# Patient Record
Sex: Female | Born: 1970 | Race: White | Hispanic: No | Marital: Married | Smoking: Never smoker
Health system: Southern US, Community
[De-identification: ages and names within clinical notes are randomized; demographics above are authoritative.]

## PROBLEM LIST (undated history)

## (undated) DIAGNOSIS — D333 Benign neoplasm of cranial nerves: Secondary | ICD-10-CM

## (undated) HISTORY — PX: NOSE SURGERY: SHX723

---

## 2019-01-16 ENCOUNTER — Encounter: Payer: Self-pay | Admitting: Neurology

## 2019-01-16 ENCOUNTER — Ambulatory Visit (INDEPENDENT_AMBULATORY_CARE_PROVIDER_SITE_OTHER): Payer: Managed Care, Other (non HMO) | Admitting: Neurology

## 2019-01-16 ENCOUNTER — Other Ambulatory Visit: Payer: Self-pay

## 2019-01-16 ENCOUNTER — Telehealth: Payer: Self-pay | Admitting: Neurology

## 2019-01-16 VITALS — BP 87/58 | HR 75 | Ht 68.0 in | Wt 144.0 lb

## 2019-01-16 DIAGNOSIS — M25532 Pain in left wrist: Secondary | ICD-10-CM | POA: Diagnosis not present

## 2019-01-16 DIAGNOSIS — M25531 Pain in right wrist: Secondary | ICD-10-CM | POA: Diagnosis not present

## 2019-01-16 DIAGNOSIS — R202 Paresthesia of skin: Secondary | ICD-10-CM

## 2019-01-16 NOTE — Progress Notes (Signed)
Reason for visit: Tongue paresthesias, taste alteration  Referring physician: Dr. Daryl Eastern Gina Joseph is a 47 y.o. female  History of present illness:  Gina Joseph is a 48 year old right-handed white female with a history of some metallic taste alteration that occurred at the end of 2019.  The patient went off of magnesium supplementation and she was seen for a fullness sensation in the left ear through ENT with Dr. Redmond Baseman.  She was treated with medications for allergies and she was given antibiotics.  Around that time, the metallic taste seem to improve.  Within the last 2 to 3 months however, the same problem has come back on but this is now associated with an altered sensation in the lateral portion of her tongue on the left side.  This was not present previously.  Just as previously, she is now developing a fullness sensation in the left ear.  The patient reports no sinus drainage or sinus discomfort.  The patient is not having any pain in the left ear.  She reports some occasional problems with dizziness when she stoops over and then comes back up again.  She might feel a bit off balance when this occurs.  She has not had any falls.  She reports no numbness or weakness of the arms or legs or face.  She has not had any change in speech or swallowing or any alteration in vision or double vision or loss of vision.  She denies any significant neck pain or pain down the arms.  She has no difficulty controlling the bowels or the bladder.  She comes to this office for further evaluation.  History reviewed. No pertinent past medical history.  Past Surgical History:  Procedure Laterality Date  . CESAREAN SECTION     x2  . NOSE SURGERY      Family History  Problem Relation Age of Onset  . High blood pressure Mother   . Stroke Father     Social history:  reports that she has never smoked. She has never used smokeless tobacco. She reports current alcohol use. She reports that she  does not use drugs.  Medications:  Prior to Admission medications   Medication Sig Start Date End Date Taking? Authorizing Provider  Ginkgo Biloba (GINKOBA PO) Take by mouth.   Yes [provider]  Multiple Vitamin (MULTIVITAMIN) capsule Take 1 capsule by mouth daily.   Yes [provider]  Turmeric (QC TUMERIC COMPLEX PO) Take by mouth.   Yes [provider]     No Known Allergies  ROS:  Out of a complete 14 system review of symptoms, the patient complains only of the following symptoms, and all other reviewed systems are negative.  Taste alteration  Blood pressure (!) 87/58, pulse 75, height 5\' 8"  (1.727 m), weight 144 lb (65.3 kg).  Physical Exam  General: The patient is alert and cooperative at the time of the examination.  Eyes: Pupils are equal, round, and reactive to light. Discs are flat bilaterally.  Ears: The left tympanic membrane may be slightly bulging, no inflammation is seen.  The right tympanic membrane appears to be normal.  Neck: The neck is supple, no carotid bruits are noted.  Respiratory: The respiratory examination is clear.  Cardiovascular: The cardiovascular examination reveals a regular rate and rhythm, no obvious murmurs or rubs are noted.  Skin: Extremities are without significant edema.  Neurologic Exam  Mental status: The patient is alert and oriented x 3 at  the time of the examination. The patient has apparent normal recent and remote memory, with an apparently normal attention span and concentration ability.  Cranial nerves: Facial symmetry is present. There is good sensation of the face to pinprick and soft touch bilaterally. The strength of the facial muscles and the muscles to head turning and shoulder shrug are normal bilaterally. Speech is well enunciated, no aphasia or dysarthria is noted. Extraocular movements are full. Visual fields are full. The tongue is midline, and the patient has symmetric elevation of the  soft palate. No obvious hearing deficits are noted.  Motor: The motor testing reveals 5 over 5 strength of all 4 extremities. Good symmetric motor tone is noted throughout.  Sensory: Sensory testing is intact to pinprick, soft touch, vibration sensation, and position sense on all 4 extremities. No evidence of extinction is noted.  Coordination: Cerebellar testing reveals good finger-nose-finger and heel-to-shin bilaterally.  Gait and station: Gait is normal. Tandem gait is normal. Romberg is negative. No drift is seen.  Reflexes: Deep tendon reflexes are symmetric and normal bilaterally. Toes are downgoing bilaterally.   Assessment/Plan:  1.  Subjective taste alteration, left tongue sensory alteration  The patient has had no true change in his sense of detecting flavors of food, she just has a metallic type taste.  She reports no viral illnesses around the time of onset but she does have a fullness sensation in the left ear.  There is a slight bulging of the left eardrum on clinical examination today.  The patient will be set up for MRI of the brain to exclude demyelinating disease, further blood work will be done today.  The patient does report some foot pain and some arthralgia discomfort in the wrists at times.  She will follow-up here depending upon the results of the above evaluation.  Jill Alexanders MD 01/16/2019 9:37 AM  [facility] [address] [place], [place] [address]  Phone [phone] Fax [phone]

## 2019-01-16 NOTE — Telephone Encounter (Signed)
[employer] order sent to GI. They will obtain the auth and reach out to the patient to schedule.  

## 2019-01-18 LAB — ANA W/REFLEX: Anti Nuclear Antibody (ANA): NEGATIVE

## 2019-01-18 LAB — RHEUMATOID FACTOR: Rheumatoid fact SerPl-aCnc: <10 IU/mL (ref 0.0–13.9)

## 2019-01-18 LAB — SEDIMENTATION RATE: Sed Rate: 2 mm/hr (ref 0–32)

## 2019-01-18 LAB — ZINC: Zinc: 94 ug/dL (ref 56–134)

## 2019-01-18 LAB — TSH: TSH: 2.04 u[IU]/mL (ref 0.450–4.500)

## 2019-01-18 LAB — B. BURGDORFI ANTIBODIES: Lyme IgG/IgM Ab: <0.91 {ISR} (ref 0.00–0.90)

## 2019-01-18 LAB — VITAMIN B12: Vitamin B-12: 731 pg/mL (ref 232–1245)

## 2019-01-18 LAB — COPPER, SERUM: Copper: 155 ug/dL (ref 72–166)

## 2019-01-22 ENCOUNTER — Telehealth: Payer: Self-pay

## 2019-01-22 NOTE — Telephone Encounter (Signed)
-----   Message from Kathrynn Ducking, MD sent at 01/18/2019  4:58 PM EDT -----  The blood work results are unremarkable. Please call the patient. ----- Message ----- From: Lavone Neri Lab Results In Sent: 01/17/2019   7:37 AM EDT To: Kathrynn Ducking, MD

## 2019-01-22 NOTE — Telephone Encounter (Signed)
I reached out to pt and advised of results. She verbalized understanding. Pt request my chart activation code to be sent to her mobile #. I have resent.

## 2019-02-12 ENCOUNTER — Other Ambulatory Visit: Payer: Managed Care, Other (non HMO)

## 2019-05-16 ENCOUNTER — Telehealth: Payer: Self-pay | Admitting: Neurology

## 2019-05-16 DIAGNOSIS — M25531 Pain in right wrist: Secondary | ICD-10-CM

## 2019-05-16 DIAGNOSIS — R202 Paresthesia of skin: Secondary | ICD-10-CM

## 2019-05-16 DIAGNOSIS — M25532 Pain in left wrist: Secondary | ICD-10-CM

## 2019-05-16 NOTE — Telephone Encounter (Signed)
I left a voicemail for patient to call back. I need her insurance information. If she calls back please ask her for her insurance information.

## 2019-05-16 NOTE — Telephone Encounter (Signed)
MRI brain with and without contrast re-ordered on Dr. Tobey Grim behalf.

## 2019-05-16 NOTE — Telephone Encounter (Signed)
Noted, thank you

## 2019-05-16 NOTE — Telephone Encounter (Signed)
[employer] Josem [id number] (exp. 05/16/19 to 06/30/19) but because that patient was scheduled at [facility] on 02/12/19 and cancelled I'll need a new MRI order to schedule off the new accession number.

## 2019-05-16 NOTE — Telephone Encounter (Signed)
Patient called back and received insurance information. Patient now has [facility]

## 2019-05-16 NOTE — Telephone Encounter (Signed)
Pt called stating that she had an insurance change and now everything is updated with the new insurance information and she is ready to schedule her MRI. Please advise.

## 2019-05-17 NOTE — Telephone Encounter (Signed)
I spoke to the patient she is going to reach out to her insurance company and get back to me on where she would like to have her MRI at.

## 2019-06-06 NOTE — Telephone Encounter (Signed)
Noted, faxed order to [facility] they will reach out to the patient to schedule.

## 2019-06-06 NOTE — Telephone Encounter (Signed)
Pt has called to inform she has found a location as to where she would like to have her MRI. [facility] ph [phone] fax [phone].  Pt states they need the order and authorization faxed to them.  Pt is asking for a call from Raquel Sarna to discuss cost

## 2019-07-02 ENCOUNTER — Telehealth: Payer: Self-pay | Admitting: Neurology

## 2019-07-02 DIAGNOSIS — D333 Benign neoplasm of cranial nerves: Secondary | ICD-10-CM

## 2019-07-02 NOTE — Telephone Encounter (Signed)
I called the patient.  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  I will send the patient for neurosurgical evaluation, the patient likely will eventually not need open surgery but a gamma knife procedure.  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

## 2019-07-19 ENCOUNTER — Other Ambulatory Visit: Payer: Self-pay | Admitting: Radiation Therapy

## 2019-07-24 ENCOUNTER — Ambulatory Visit
Admission: RE | Admit: 2019-07-24 | Discharge: 2019-07-24 | Disposition: A | Discharge status: Home / Self Care | Payer: 59 | Source: Ambulatory Visit | Attending: Radiation Oncology | Admitting: Radiation Oncology

## 2019-07-24 ENCOUNTER — Telehealth: Payer: Self-pay | Admitting: Radiation Oncology

## 2019-07-24 ENCOUNTER — Encounter: Payer: Self-pay | Admitting: Radiation Oncology

## 2019-07-24 VITALS — Ht 69.0 in | Wt 138.0 lb

## 2019-07-24 DIAGNOSIS — D333 Benign neoplasm of cranial nerves: Secondary | ICD-10-CM

## 2019-07-24 HISTORY — DX: Benign neoplasm of cranial nerves: D33.3

## 2019-07-24 NOTE — Progress Notes (Signed)
Referral from Dr. Annette Stable.   Left sided vestibular schwannoma.   Patient explains that in November 2019 she presented to her PCP with dizziness. She was prescribed an antibiotic which didn't help. When she began having problems with her ear she returned to her PCP who then referred her to ENT. She explains the ENT prescribed Flonase which helped for sometime. Then, she developed numbness on the left side of her tongue and was referred to Dr. Annette Stable.   Current symptoms: Warmth and numbness in left leg and a finger on her left hand. Reports her hearing is diminishing. Reports continued intermittent dizziness. Reports continued occasional unsteadiness. Denies diplopia but explains when she focuses on an object with her eyes it tends to move a bit. Denies headache. Denies facial weakness.   Denies a history of radiation therapy Denies being pregnant  Denies having a pacemaker Denies taking methotrexate.   49 year old female. Married to Anon Raices with two sons. Homemaker.

## 2019-07-24 NOTE — Progress Notes (Signed)
Radiation Oncology         [phone] ________________________________  Initial outpatient Consultation - Conducted via MyChart due to current COVID-19 concerns for limiting patient exposure  Name: Gina Joseph MRN: [id number]  Date of Service: 07/24/2019 DOB: May 31, 1970  [id number], Thayer Headings, NP  Earnie Larsson, MD   REFERRING PHYSICIAN: Earnie Larsson, MD  DIAGNOSIS: The primary encounter diagnosis was Vestibular schwannoma [facility]). A diagnosis of Acoustic neuroma [facility]) was also pertinent to this visit.    ICD-10-CM   1. Vestibular schwannoma ([place])  D33.3   2. Acoustic neuroma [facility])  D33.3     HISTORY OF PRESENT ILLNESS: Gina Joseph is a 49 y.o. female seen at the request of Dr. Annette Stable. She initially presented to her PCP with intermittent dizziness and occasional unsteadiness in 01/2018. She was prescribed an antibiotic but saw no improvement. She then began to develop left ear fullness and was referred to Dr. Redmond Baseman in ENT in 04/2018. She was prescribed Flonase, which helped some but she later developed left-sided tongue numbness and loss of taste in 12/2018, at which time she was referred to Dr. Jannifer Franklin. The recommendation was to proceed with brain MRI but unfortunately, with a change in her insurance, her MRI was delayed. This was eventually performed on 06/28/2019 and revealed a 2.3 cm left vestibular schwannoma, extending into left internal auditory canal/porus acousticus.  She was referred to Dr. Annette Stable to discuss possible surgical resection but both Dr. Annette Stable and the patient felt stereotactic radiosurgery [facility]) would be preferable. Therefore, she has been referred to [place] today to further discuss the potential role of SRS in the management of her acoustic neuroma.  PREVIOUS RADIATION THERAPY: No  PAST MEDICAL HISTORY:  Past Medical History:  Diagnosis Date  . Vestibular schwannoma ([place])       PAST SURGICAL HISTORY: Past Surgical History:  Procedure Laterality Date  . CESAREAN  SECTION     x2  . NOSE SURGERY      FAMILY HISTORY:  Family History  Problem Relation Age of Onset  . High blood pressure Mother   . Stroke Father   . Cancer Neg Hx     SOCIAL HISTORY:  Social History   Socioeconomic History  . Marital status: Married    Spouse name: Jose   . Number of children: 2  . Years of education: Not on file  . Highest education level: Some college, no degree  Occupational History  . Occupation: House Wife   Tobacco Use  . Smoking status: Never Smoker  . Smokeless tobacco: Never Used  Substance and Sexual Activity  . Alcohol use: Yes    Comment: A glass of wine now and then 4 within the month  . Drug use: Never  . Sexual activity: Yes  Other Topics Concern  . Not on file  Social History Narrative   Right handed   Lives at home with spouse    Caffeine~ 4-5 cups per day    Social Determinants of Health   Financial Resource Strain:   . Difficulty of Paying Living Expenses:   Food Insecurity:   . Worried About Charity fundraiser in the Last Year:   . Arboriculturist in the Last Year:   Transportation Needs:   . Film/video editor (Medical):   Marland Kitchen Lack of Transportation (Non-Medical):   Physical Activity:   . Days of Exercise per Week:   . Minutes of Exercise per Session:   Stress:   . Feeling  of Stress :   Social Connections:   . Frequency of Communication with Friends and Family:   . Frequency of Social Gatherings with Friends and Family:   . Attends Religious Services:   . Active Member of Clubs or Organizations:   . Attends Archivist Meetings:   Marland Kitchen Marital Status:   Intimate Partner Violence:   . Fear of Current or Ex-Partner:   . Emotionally Abused:   Marland Kitchen Physically Abused:   . Sexually Abused:     ALLERGIES: Patient has no known allergies.  MEDICATIONS:  Current Outpatient Medications  Medication Sig Dispense Refill  . NON FORMULARY Restore Supplement for stomach support    . NON FORMULARY BROQ. "good for  abnormal cells"    . Turmeric (QC TUMERIC COMPLEX PO) Take by mouth.     No current facility-administered medications for this encounter.    REVIEW OF SYSTEMS:  On review of systems, the patient reports that she is doing well overall. She denies any chest pain, shortness of breath, cough, fevers, chills, night sweats, unintended weight changes. She denies any bowel or bladder disturbances, and denies abdominal pain, nausea or vomiting. She denies any new musculoskeletal or joint aches or pains. She reports warmth and numbness in her left leg and a finger on her left hand, diminished hearing in the left ear, continued intermittent dizziness, and occasional unsteadiness. She denies diplopia but explains that she does have some blurred vision when she really focuses on an object with her eyes. She denies any facial numbness or weakness and has not had any headaches.  A complete review of systems is obtained and is otherwise negative.    PHYSICAL EXAM:  Wt Readings from Last 3 Encounters:  07/24/19 138 lb (62.6 kg)  01/16/19 144 lb (65.3 kg)   Temp Readings from Last 3 Encounters:  No data found for Temp   BP Readings from Last 3 Encounters:  01/16/19 (!) 87/58   Pulse Readings from Last 3 Encounters:  01/16/19 75   Pain Assessment Pain Score: 0-No pain/10  In general, this is a well appearing [place] African female in no acute distress. She's alert and oriented x4 and appropriate throughout the examination. Cardiopulmonary assessment is negative for acute distress and she exhibits normal effort.   KPS = 90  100 - Normal; no complaints; no evidence of disease. 90   - Able to carry on normal activity; minor signs or symptoms of disease. 80   - Normal activity with effort; some signs or symptoms of disease. 80   - Cares for self; unable to carry on normal activity or to do active work. 60   - Requires occasional assistance, but is able to care for most of his personal needs. 50   -  Requires considerable assistance and frequent medical care. 20   - Disabled; requires special care and assistance. 40   - Severely disabled; hospital admission is indicated although death not imminent. 50   - Very sick; hospital admission necessary; active supportive treatment necessary. 10   - Moribund; fatal processes progressing rapidly. 0     - Dead  Karnofsky DA, Abelmann WH, Craver LS and Burchenal JH [phone]) The use of the nitrogen mustards in the palliative treatment of carcinoma: with particular reference to bronchogenic carcinoma Cancer 1 634-56  LABORATORY DATA:  No results found for: WBC, HGB, HCT, MCV, PLT No results found for: NA, K, CL, CO2 No results found for: ALT, AST, GGT, ALKPHOS, BILITOT  RADIOGRAPHY: No results found.    IMPRESSION/PLAN: This visit was conducted via MyChart to spare the patient unnecessary potential exposure in the healthcare setting during the current COVID-19 pandemic. 1. 49 y.o. woman with a 2.3 cm left acoustic neuroma  Today, we reviewed the findings and workup thus far with the patient.  At this point, the patient would potentially benefit from stereotactic radiosurgery versus surgical resection. We discussed the recommendation for a single fraction of stereotactic radiosurgery [facility]) and focused on the details and logistics of delivery. We discussed and outlined the risks, benefits, short and long-term effects associated with SRS and compared and contrasted these with surgical resection highlighting the pros and cons of each. We reviewed the results associated with each of the treatments described above and the patient seems to understand the treatment options and would like to proceed with stereotactic radiosurgery.  We will share our discussion with Dr. Annette Stable and move forward with coordinating CT simulation for treatment planning in anticipation of moving forward with a single fraction SRS treatment to the left acoustic neuroma in the near future.  She  has provided verbal consent to proceed today and will sign formal written consent at the time of her CT simulation and a copy of this document will be placed in her medical record.  She knows that she is welcome to call at anytime with any questions or concerns in the interim.  Given current concerns for patient exposure during the COVID-19 pandemic, this encounter was conducted via video-enabled WebEx visit. The patient has given verbal consent for this type of encounter. The time spent during this encounter was 60 minutes. The attendants for this meeting include Tyler Pita MD, Ashlyn Bruning PA-C, [place], patient, Leidy Bodin. During the encounter, Tyler Pita MD, Ashlyn Bruning PA-C, and scribe, Wilburn Mylar were located at [place].  Patient, Gina Joseph was located at home.    Nicholos Johns, PA-C    Tyler Pita, MD  [facility] Direct Dial: [phone]  Fax: [phone] [email or web address]  Skype  LinkedIn   This document serves as a record of services personally performed by Tyler Pita, MD and Freeman Caldron, PA-C. It was created on their behalf by Wilburn Mylar, a trained medical scribe. The creation of this record is based on the scribe's personal observations and the provider's statements to them. This document has been checked and approved by the attending provider.

## 2019-07-25 ENCOUNTER — Other Ambulatory Visit: Payer: Self-pay | Admitting: Radiation Therapy

## 2019-07-25 DIAGNOSIS — D333 Benign neoplasm of cranial nerves: Secondary | ICD-10-CM | POA: Insufficient documentation

## 2019-08-01 ENCOUNTER — Other Ambulatory Visit: Payer: Self-pay | Admitting: Radiation Therapy

## 2019-08-03 ENCOUNTER — Ambulatory Visit
Admission: RE | Admit: 2019-08-03 | Discharge: 2019-08-03 | Disposition: A | Discharge status: Home / Self Care | Payer: 59 | Source: Ambulatory Visit | Attending: Radiation Oncology | Admitting: Radiation Oncology

## 2019-08-03 ENCOUNTER — Other Ambulatory Visit: Payer: Self-pay

## 2019-08-03 DIAGNOSIS — D333 Benign neoplasm of cranial nerves: Secondary | ICD-10-CM

## 2019-08-03 MED ORDER — GADOBENATE DIMEGLUMINE 529 MG/ML IV SOLN
13.0000 mL | Freq: Once | INTRAVENOUS | Status: AC | PRN
  Administered 2019-08-03: 13 mL via INTRAVENOUS

## 2019-08-07 ENCOUNTER — Ambulatory Visit: Payer: 59

## 2019-08-07 ENCOUNTER — Ambulatory Visit: Payer: 59 | Admitting: Radiation Oncology

## 2019-08-08 ENCOUNTER — Telehealth: Payer: Self-pay | Admitting: Radiation Oncology

## 2019-08-08 NOTE — Progress Notes (Signed)
Simulation cancelled. IV note not needed.

## 2019-08-08 NOTE — Telephone Encounter (Signed)
Received voicemail message from patient requesting a return call. Phoned patient back promptly. She verbalizes, "I would appreciate a call from Dr. Tammi Klippel because I am considering not moving forward with the things set up for Friday." She goes onto say she wants his interpretation of her most recent scan and his thoughts on delaying treatment. Confirmed that the best contact number for her is [phone]. Patient understands this RN will pass along her request to the providers.

## 2019-08-10 ENCOUNTER — Ambulatory Visit
Admission: RE | Admit: 2019-08-10 | Discharge: 2019-08-10 | Disposition: A | Discharge status: Home / Self Care | Payer: 59 | Source: Ambulatory Visit | Attending: Radiation Oncology | Admitting: Radiation Oncology

## 2019-08-10 ENCOUNTER — Ambulatory Visit: Payer: 59

## 2019-08-14 ENCOUNTER — Ambulatory Visit: Payer: 59

## 2019-08-14 ENCOUNTER — Ambulatory Visit: Payer: 59 | Admitting: Radiation Oncology

## 2019-08-17 ENCOUNTER — Ambulatory Visit: Payer: 59 | Admitting: Radiation Oncology

## 2019-08-21 ENCOUNTER — Ambulatory Visit: Payer: 59 | Admitting: Radiation Oncology

## 2019-08-28 ENCOUNTER — Ambulatory Visit: Payer: 59 | Admitting: Radiation Oncology

## 2019-08-28 ENCOUNTER — Telehealth: Payer: Self-pay | Admitting: Radiation Therapy

## 2019-08-28 NOTE — Telephone Encounter (Addendum)
I spoke with [facility] this morning. I let her know that after her discussion with Dr. Tammi Klippel and her expressed concern of radiation exposure, Dr. Tammi Klippel sent a message to [place] inquiring if she is a candidate for a single fraction treatment at their facility. I spoke with Gina Joseph there at [facility] to confirm the referral department has received this message. Gina Joseph said that they sent it for review by their CNS/pediatric physician, Dr. Beulah Gandy. Dr. Beulah Gandy was on vacation last week, so she has not had a chance to review and reply to Dr. Tammi Klippel, but she will ASAP and reach back out with her recommendations. I told Gina Joseph that as soon as we hear back with this information, we will call her to share and discuss next steps. Gina Joseph was very thankful for the call.   Mont Dutton R.T.(R)(T) Radiation Special Procedures Navigator   [email or web address] [phone]

## 2019-08-31 ENCOUNTER — Telehealth: Payer: Self-pay | Admitting: *Deleted

## 2019-08-31 NOTE — Telephone Encounter (Signed)
Faxed consult notes, radiology reports and notes from Dr. Annette Stable along with CD of images to Dr. Unknown Jim, [place].

## 2019-09-06 ENCOUNTER — Telehealth: Payer: Self-pay | Admitting: Radiation Oncology

## 2019-09-06 NOTE — Telephone Encounter (Signed)
Spoke to patient and reassured her that radiation records will be faxed again to [facility] just now. Records were sent by Leonard Downing last Friday, June 11, along with Tedra Coupe, in Radiology, sending a CD of images out by [facility]. Patient was agreeable.  Leonard Downing spoke with Junius Roads at [facility] and confirmed this information as well.

## 2019-10-10 ENCOUNTER — Telehealth: Payer: Self-pay | Admitting: Radiation Oncology

## 2019-10-10 NOTE — Telephone Encounter (Signed)
Received voicemail message from patient requesting return call. Phoned patient back to inquire. Patient reports she has not started proton therapy treatment yet because she awaits insurance approval. Patient states, "if insurance doesn't approve proton therapy then I will get regular radiation." Patient explains she is preparing for a two week long trip and concerned about symptoms. She reports progressive weakness and heaviness in her arms and legs. She reports dizziness is worse. She reports numbness along her side is worse. She confirms these symptoms are not preventing her from doing her daily activities. Patient confirms she wants to know from Dr. Tammi Klippel if she should be concerned about these progressive symptoms. Explained this RN will relay her questions to the providers and phone back with their response. She verbalized understanding and appreciation for the call back.

## 2019-10-25 ENCOUNTER — Telehealth: Payer: Self-pay | Admitting: Radiation Oncology

## 2019-10-25 NOTE — Telephone Encounter (Signed)
Phoned patient. No answer. Left voicemail message explaining the following per Dr. Manning:"Typically this a very slow growing tumor type, which should be reassuring. We should await the decision regarding protons, because she will likely need updated planning MRI either in [place] or here, which will show if there has been any change." Encouraged her to phone myself or Mont Dutton with further questions and provided our direct numbers.

## 2019-10-25 NOTE — Telephone Encounter (Signed)
-----   Message from Pincus Large sent at 10/23/2019  9:46 AM EDT ----- Regarding: RE: Yes, I believe Sam spoke with her about this.  Manuela Schwartz  ----- Message ----- From: Freeman Caldron, PA-C Sent: 10/23/2019   9:37 AM EDT To: Geronimo Boot, RN Subject: RE:                                            Manuela Schwartz, Just tying up loose ends this morning and wanted to check to see if the recommendation from Dr. Tammi Klippel, as below, was relayed to the patient? -Ash ----- Message ----- From: Tyler Pita, MD Sent: 10/11/2019   7:25 PM EDT To: Freeman Caldron, PA-C, Pincus Large, # Subject: RE:                                            Typically this a very slow growing tumor type, which should be reassuring.  We should await the decision regarding protons, because she will likely need updated planning MRI either in [place] or here, which will show if there has been any change. ----- Message ----- From: Freeman Caldron, PA-C Sent: 10/10/2019  11:33 AM EDT To: Tyler Pita, MD, Pincus Large, #  Message from [place]:  "Received voicemail message from patient requesting return call. Phoned patient back to inquire. Patient reports she has not started proton therapy treatment yet because she awaits insurance approval. Patient states, "if insurance doesn't approve proton therapy then I will get regular radiation." Patient explains she is preparing for a two week long trip and concerned about symptoms. She reports progressive weakness and heaviness in her arms and legs. She reports dizziness is worse. She reports numbness along her side is worse. She confirms these symptoms are not preventing her from doing her daily activities. Patient confirms she wants to know from Dr. Tammi Klippel if she should be concerned about these progressive symptoms. Explained this RN will relay her questions to the providers and phone back with their response. She verbalized understanding and appreciation for the  call back."  I will defer to Dr. Tammi Klippel on this one. At the time of consult, she was advised to have treatment sooner than later due to proximity of the lesion to the brainstem but she elected to pursue proton therapy. 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 She is a tough one.

## 2020-04-15 ENCOUNTER — Encounter: Payer: Self-pay | Admitting: Radiation Therapy

## 2020-04-15 ENCOUNTER — Encounter: Payer: Self-pay | Admitting: Radiation Oncology

## 2020-04-15 NOTE — Progress Notes (Signed)
Gina Joseph has requested a new referral be sent to [facility] for her to have the planned March follow-up MRI and visit with Dr. Julaine Hua. Her health insurance has changed and is requiring this updated referral for 2022.      Attention: Sharard, assistant to Dr. Mina Marble   Please update the referral status from Dr. Tyler Pita in order for Gina Joseph to have the March brain MRI and follow-up with Dr. Julaine Hua as planned. Afterwards, the patient desires to transfer future follow-up scans and visits locally, with neuro-oncologist, Dr. Cecil Cobbs at [facility] in [place], [place].  Thank you for your help and attention to this matter.   Mont Dutton R.T.(R)(T) [facility]   Radiation Oncology  Radiation Special Procedures Navigator Office: [phone]

## 2020-04-15 NOTE — Progress Notes (Signed)
Gina Joseph  Therapist  Specialty:  Radiation Therapy  Progress Notes     Signed  Encounter Date:  04/15/2020           Signed        Gina Joseph has requested a new referral be sent to [facility] for her to have the planned March follow-up MRI and visit with Dr. Julaine Hua. Her health insurance has changed and is requiring this updated referral for 2022.      Attention: Sharard, assistant to Dr. Mina Marble   Please update the referral status from Dr. Tyler Pita in order for Gina Joseph to have the March brain MRI and follow-up with Dr. Julaine Hua as planned. Afterwards, the patient desires to transfer future follow-up scans and visits locally, with neuro-oncologist, Dr. Cecil Cobbs at [facility] in [place], [place].  Thank you for your help and attention to this matter.   Mont Dutton R.T.(R)(T) [facility] Radiation Oncology Radiation Special Procedures Navigator Office: [phone]

## 2021-02-19 ENCOUNTER — Telehealth: Payer: Self-pay | Admitting: Radiation Therapy

## 2021-02-19 NOTE — Telephone Encounter (Signed)
I spoke with Gina Joseph to let her know we are still working on getting her external imaging from [place] and [place]. As soon as we are able to get this information uploaded into our PACs for review we will call her back and set up a follow-up with Dr. Tammi Klippel. She is happy with this plan.   Mont Dutton R.T.(R)(T) Radiation Special Procedures Navigator

## 2021-02-20 ENCOUNTER — Other Ambulatory Visit: Payer: Self-pay

## 2021-02-20 ENCOUNTER — Other Ambulatory Visit: Payer: Self-pay | Admitting: Radiation Oncology

## 2021-02-20 ENCOUNTER — Ambulatory Visit
Admission: RE | Admit: 2021-02-20 | Discharge: 2021-02-20 | Disposition: A | Discharge status: Home / Self Care | Payer: Self-pay | Source: Ambulatory Visit | Attending: Radiation Oncology | Admitting: Radiation Oncology

## 2021-02-20 DIAGNOSIS — D333 Benign neoplasm of cranial nerves: Secondary | ICD-10-CM

## 2021-02-25 ENCOUNTER — Other Ambulatory Visit: Payer: Self-pay | Admitting: Radiation Therapy

## 2021-03-03 ENCOUNTER — Telehealth: Payer: Self-pay | Admitting: Radiation Therapy

## 2021-03-03 ENCOUNTER — Telehealth: Payer: Self-pay | Admitting: Internal Medicine

## 2021-03-03 ENCOUNTER — Other Ambulatory Visit: Payer: Self-pay | Admitting: Radiation Therapy

## 2021-03-03 DIAGNOSIS — D333 Benign neoplasm of cranial nerves: Secondary | ICD-10-CM

## 2021-03-03 NOTE — Telephone Encounter (Signed)
Scheduled appt per 12/12 referral. Spoke to pt who is aware of appt date and time. Pt is self pay so I also provided her with her good faith estimate.

## 2021-03-03 NOTE — Telephone Encounter (Signed)
I spoke with Ms. Nemitz to let her know we reviewed her imaging in our 03/02/21 Brain and Spine conference . Recommendations are to get her in to see Dr. 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 A referral has been entered in Epic and Dr. Renda Rolls nurse notified. Ms. Dawn knows to expect a call from Dr. Renda Rolls office to set up that consult visit.   Mont Dutton R.T.(R)(T) Radiation Special Procedures Navigator

## 2021-03-26 ENCOUNTER — Inpatient Hospital Stay: Payer: Self-pay | Attending: Internal Medicine | Admitting: Internal Medicine

## 2021-03-26 ENCOUNTER — Other Ambulatory Visit: Payer: Self-pay

## 2021-03-26 VITALS — BP 94/61 | HR 70 | Temp 97.7°F | Resp 20 | Wt 159.4 lb

## 2021-03-26 DIAGNOSIS — Z923 Personal history of irradiation: Secondary | ICD-10-CM | POA: Insufficient documentation

## 2021-03-26 DIAGNOSIS — D333 Benign neoplasm of cranial nerves: Secondary | ICD-10-CM | POA: Insufficient documentation

## 2021-03-26 MED ORDER — DEXAMETHASONE 4 MG PO TABS: 4.0000 mg | ORAL_TABLET | Freq: Every day | ORAL | 0 refills | Status: AC

## 2021-03-26 NOTE — Progress Notes (Signed)
[facility] [place], [place] [address] [phone]   New Patient Evaluation  Date of Service: 03/26/21 Patient Name: Gina Joseph Patient MRN: [id number] Patient DOB: May 23, 1970 Provider: Ventura Sellers, MD  Identifying Statement:  Gina Joseph is a 51 y.o. female with  left   CNVIII schwannoma  who presents for initial consultation and evaluation.    Referring Provider: Lyman Bishop, DO [place],  [place] [id number]  Oncologic History: November 2021: Completes proton radiation therapy at [facility] 02/10/21: Presents to ED with new clinical symptoms   History of Present Illness: The patient's records from the referring physician were obtained and reviewed and the patient interviewed to confirm this HPI.  Gina Joseph presents today for evaluation of previously treated left vestibular schwannoma.  She presented to ED in November this year with new symptoms: headache, left facial numbness, slight imbalance.  This has improved in large part in recent weeks, although when she went skiing with her family it was very difficult to balance and stay upright.   Otherwise maintains functional independence.  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  Medications: Current Outpatient Medications on File Prior to Visit  Medication Sig Dispense Refill   NON FORMULARY Restore Supplement for stomach support     NON FORMULARY BROQ. "good for abnormal cells"     Turmeric (QC TUMERIC COMPLEX PO) Take by mouth.     No current facility-administered medications on file prior to visit.    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   Tobacco Use   Smoking status: Never   Smokeless tobacco: Never  Vaping Use   Vaping Use: Never used  Substance and Sexual Activity   Alcohol use: Yes    Comment: A glass of wine now and then 4 within the month   Drug use: Never   Sexual activity: Yes  Other Topics Concern   Not on file  Social History Narrative   Right handed   Lives at home with spouse    Caffeine~ 4-5 cups per day    Social Determinants of Health   Financial Resource Strain: Not on file  Food Insecurity: Not on file  Transportation Needs: Not on file  Physical Activity: Not on file  Stress: Not on file  Social Connections: Not on file  Intimate Partner Violence: Not on file   Family History:  Family History  Problem Relation Age of Onset   High blood pressure Mother    Stroke Father    Cancer Neg Hx     Review of Systems: Constitutional: Doesn't report fevers, chills or abnormal weight loss Eyes: Doesn't report blurriness of vision Ears, nose, mouth, throat, and face: Doesn't report sore throat Respiratory: Doesn't report cough, dyspnea or wheezes Cardiovascular: Doesn't report palpitation, chest discomfort  Gastrointestinal:  Doesn't report nausea, constipation, diarrhea GU: Doesn't report incontinence Skin: Doesn't report skin rashes Neurological: Per HPI Musculoskeletal: Doesn't report joint pain Behavioral/Psych: Doesn't report anxiety  Physical Exam: Vitals:   03/26/21 1007  BP: 94/61  Pulse: 70  Resp: 20  Temp: 97.7 F (36.5 C)  SpO2: 100%   KPS: 90. General: Alert, cooperative, pleasant, in no acute distress Head: Normal EENT: No conjunctival injection or scleral icterus.  Lungs: Resp effort normal Cardiac: Regular  rate Abdomen: Non-distended abdomen Skin: No rashes cyanosis or petechiae. Extremities: No clubbing or edema  Neurologic Exam: Mental Status: Awake, alert, attentive to examiner. Oriented to self and environment. Language is fluent with intact comprehension.  Cranial Nerves: Hearing impaired on left. Visual acuity is grossly normal. Visual fields are full. Extra-ocular movements intact. No ptosis. Face is symmetric Motor: Tone and bulk are normal. Power is full in both arms and legs. Reflexes are symmetric, no pathologic reflexes present.  Sensory: Intact to light touch Gait: Normal.   Labs: I have reviewed the data as listed No results found for: NA, K, CL, CO2, GLUCOSE, BUN, CREATININE, CALCIUM, PROT, ALBUMIN, AST, ALT, ALKPHOS, BILITOT, GFRNONAA, GFRAA No results found for: WBC, NEUTROABS, HGB, HCT, MCV, PLT  Imaging: [facility] Clinician Interpretation: I have personally reviewed the CNS images as listed.  My interpretation, in the context of the patient's clinical presentation, is likely treatment effect     Assessment/Plan Acoustic neuroma [facility])  We appreciate the opportunity to participate in the care of Gina Joseph.  She presents with clinical and radiographic syndrome localizing to left CP angle and brainstem parenchyma.  Etiology is very likely just post-radiation inflammation.  Tumor is unchanged in dimension or slightly smaller.    We recommended trial of dexamethasone, 4mg  daily x7 days.  Once she begins the trial she will call to set up a phone visit for the end of the week.  We reviewed potential side effects.  [facility] will continue to follow the schwannoma with serial imaging.  We spent twenty additional minutes teaching regarding the natural history, biology, and historical experience in the treatment of brain tumors. We then discussed in detail the current recommendations for therapy focusing on the mode of administration, mechanism of action, anticipated  toxicities, and quality of life issues associated with this plan. We also provided teaching sheets for the patient to take home as an additional resource.  All questions were answered. The patient knows to call the clinic with any problems, questions or concerns. No barriers to learning were detected.  The total time spent in the encounter was 60 minutes and more than 50% was on counseling and review of test results   Ventura Sellers, MD Medical Director of Neuro-Oncology [facility] [place] 03/26/21 10:08 AM

## 2021-04-27 ENCOUNTER — Telehealth: Payer: Self-pay | Admitting: Radiation Therapy

## 2021-04-27 NOTE — Telephone Encounter (Signed)
Called to follow-up with Ms. Asfour regarding the 03/26/21 visit with Dr. Mickeal Skinner. He had prescribed a short course of steroids to treat the inflammation caused by the radiation treatments. Ms. Forrester was to call once she started the meds so we could schedule a follow-up telephone visit to check for improvement. Since it is now Feb 6, and I do not see a follow-up with Dr. Mickeal Skinner scheduled, I wanted to make sure we had not missed documenting the effect of the prescribed steroid course.   Per Ms. Jonas, she decided not to take the steroids and is feeling slightly better. She is hoping that she will improve over time without intervention and does not want to schedule any follow-up at this time. She plans to have another MRI with [facility] in June or July of this year and will reach back out to [place] if she has concerns or changes her mind about scheduling an appointment.   Mont Dutton R.T.(R)(T) Radiation Special Procedures Navigator

## 2021-07-07 IMAGING — MR MR HEAD WO/W CM
11 of 12 series · 43 of 48 positions shown · IV contrast (multihance)
Comparison: MRI of the head without and with contrast 06/28/2019

CLINICAL DATA: Left vestibular schwannoma.
TECHNIQUE: Multiplanar, multiecho pulse sequences of the brain and surrounding
structures were obtained without and with intravenous contrast.

CONTRAST:  13mL MULTIHANCE GADOBENATE DIMEGLUMINE 529 MG/ML IV SOLN

[Series 10: T1 · sagittal · 4.0mm · 0.75mm/px · 4 of 32 slices shown (1 of 4)]
[im 1/32]
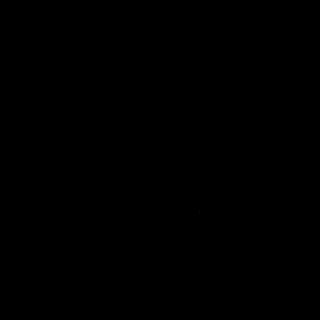
[im 11/32]
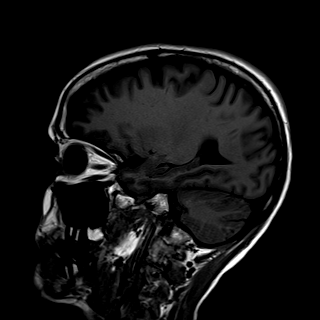
[im 21/32]
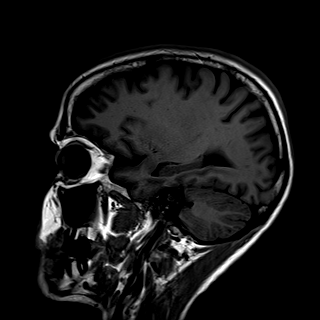
[im 32/32]
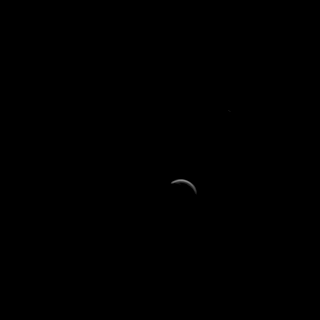

[Series 11: DWI · axial · 3.0mm · 1.50mm/px · z∈[-43,+115]mm · 7 of 86 slices shown (1 of 2)]
[im 1/86]
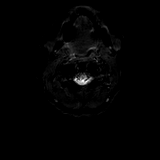
[im 15/86]
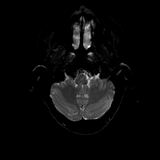
[im 29/86]
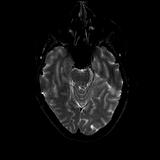
[im 43/86]
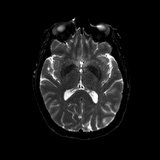
[im 57/86]
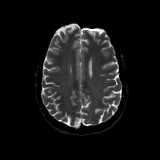
[im 71/86]
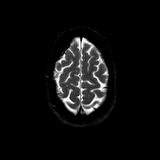
[im 86/86]
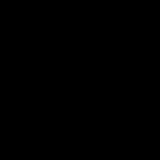

[Series 12: DWI · axial · 3.0mm · 1.50mm/px · z∈[-43,+115]mm · 3 of 41 slices shown (2 of 2)]
[im 1/41]
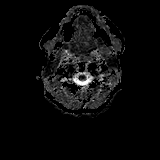
[im 21/41]
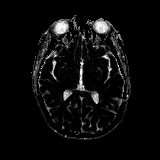
[im 41/41]
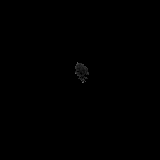

[Series 13: T2 · axial · 4.0mm · 0.38mm/px · z∈[-19,+134]mm · 3 of 33 slices shown]
[im 1/33]
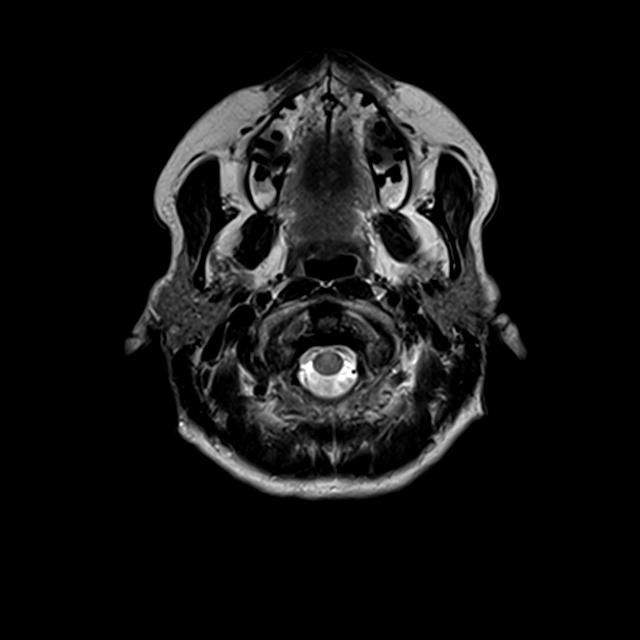
[im 17/33]
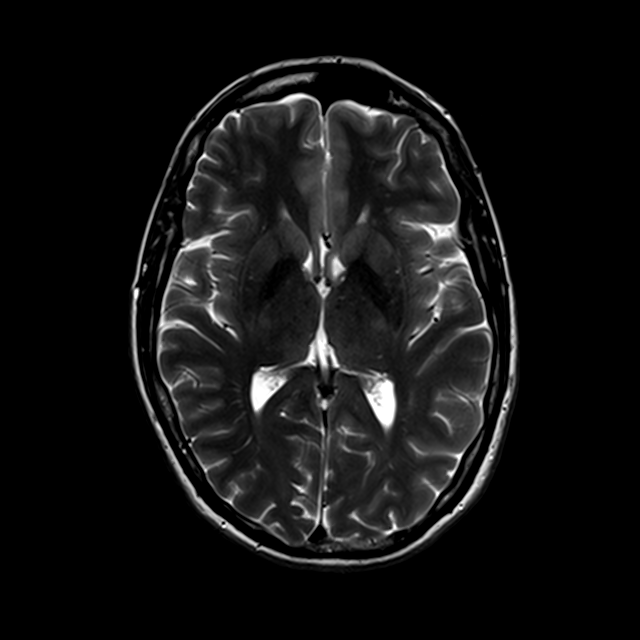
[im 33/33]
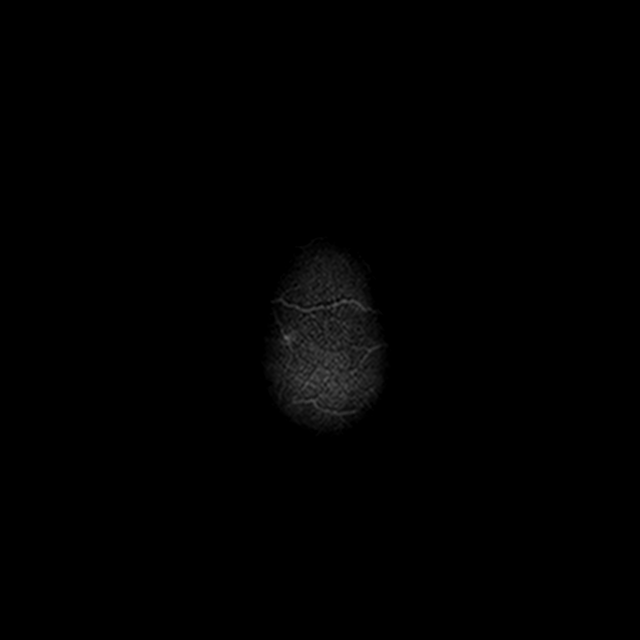

[Series 14: FLAIR · axial · 4.0mm · 0.75mm/px · z∈[-19,+134]mm · 3 of 33 slices shown]
[im 1/33]
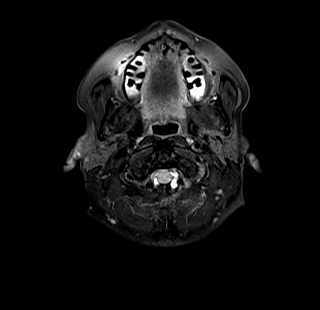
[im 17/33]
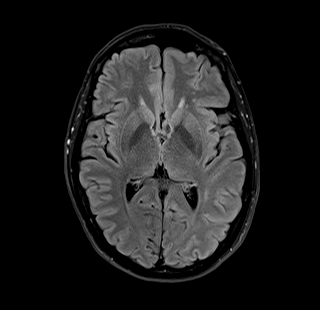
[im 33/33]
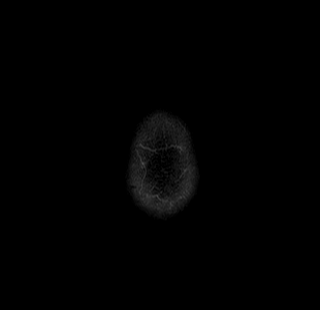

[Series 16: swi_images · axial · 2.0mm · 0.90mm/px · z∈[-12,+130]mm · 6 of 72 slices shown]
[im 1/72]
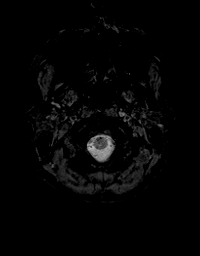
[im 15/72]
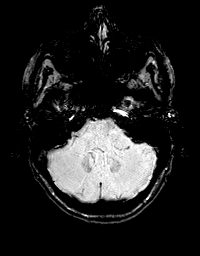
[im 29/72]
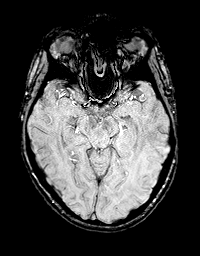
[im 43/72]
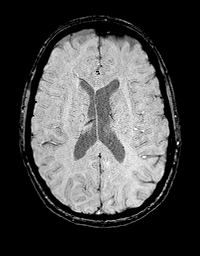
[im 57/72]
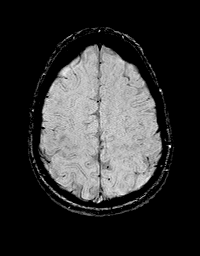
[im 72/72]
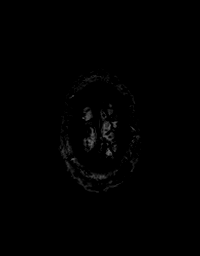

[Series 17: T1 · coronal · 3.0mm · 0.47mm/px · 1 of 13 slices shown (2 of 4)]
[im 1/13]
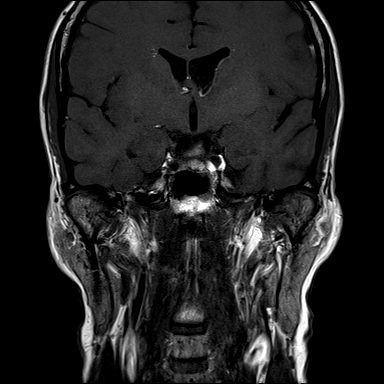

[Series 18: T1 · axial · 3.0mm · 0.47mm/px · 1 of 15 slices shown (3 of 4)]
[im 1/15]
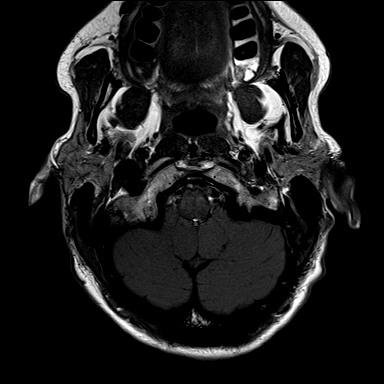

[Series 24: T1 post-contrast · coronal · 2.5mm · 0.56mm/px · 1 of 15 slices shown (1 of 2)]
[im 1/15]
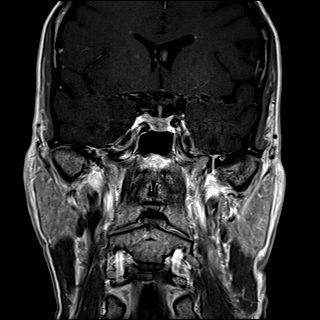

[Series 25: T1 post-contrast · axial · 2.5mm · 0.50mm/px · 1 of 15 slices shown (2 of 2)]
[im 1/15]
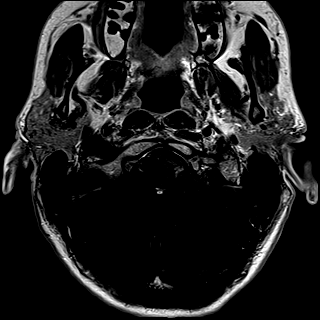

[Series 26: T1 · axial · 1.0mm · 0.75mm/px · z∈[-28,+130]mm · 13 of 160 slices shown (4 of 4)]
[im 1/160]
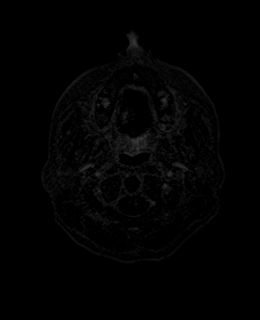
[im 14/160]
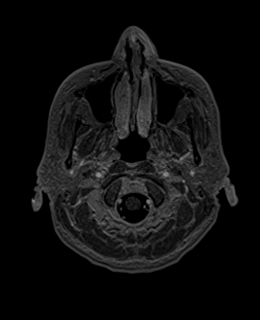
[im 27/160]
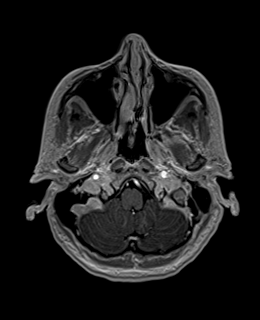
[im 40/160]
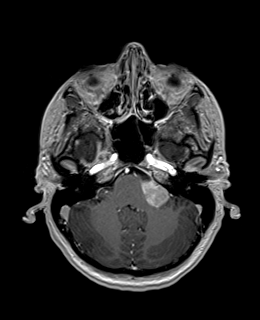
[im 54/160]
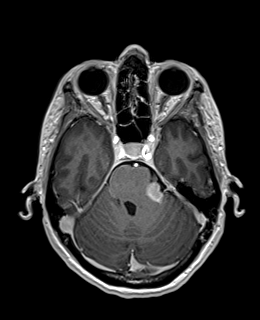
[im 67/160]
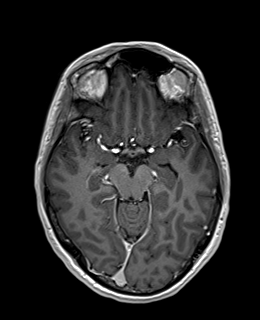
[im 80/160]
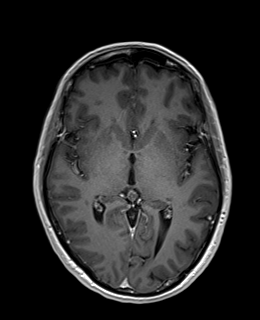
[im 93/160]
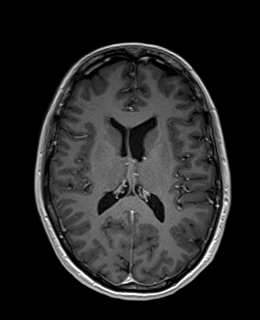
[im 107/160]
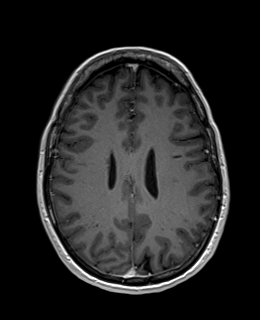
[im 120/160]
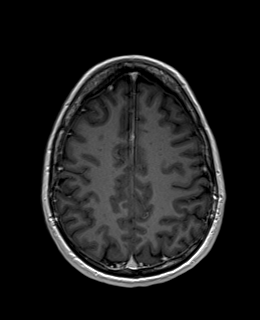
[im 133/160]
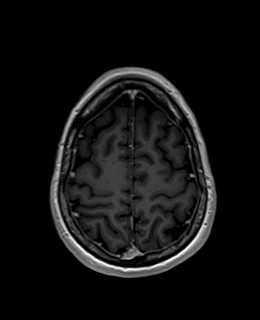
[im 146/160]
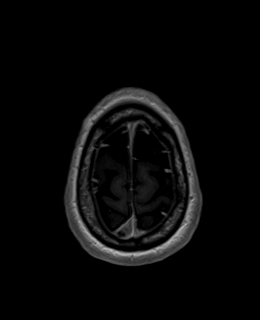
[im 160/160]
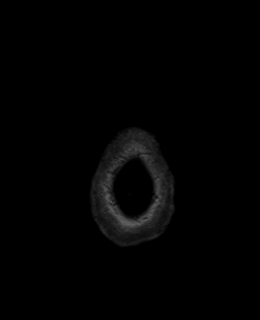

[43 of 48 positions shown; findings below may reference images not displayed]

Patient had a severe vasovagal reaction following the injection of
MultiHance. Reports no such reaction after Gadavist 4 weeks ago. She
became extremely diaphoretic and reported abnormal feeling in her
chest. She became syncopal x 2. Initial blood pressure was 80/22.
Choose placed in Trendelenburg position and given 1.5 L normal
saline. Her blood pressure returned to normal. She had returned to
baseline prior to discharge. The imaging was performed approximately
1 after scratched at the imaging was performed proximal 1 hour after
the initial contrast injection.

EXAM:
MRI HEAD WITHOUT AND WITH CONTRAST
FINDINGS: Brain: Left ICA and CP angle mass lesion has not significantly
changed in size. The area of enhancement has slightly increased
likely due to the length time following the initial contrast
reaction. Tumor margins are less distinct than on the prior study.
Slight overall increase in size is noted. Maximal measurements are
now 2.5 x 2.7 x 2.3 cm. This compares with 2.2 x 2.5 x 2.1 Cm.
Enhancing tumor extends into the IAC. There is no enhancement within
the inner ear structures. No right-sided tumor is present.

Mass effect is noted the left cerebellar peduncle and brainstem some
T2 signal hyperintensity in the white matter.

Supratentorial structures are within normal limits. Acute infarct or
hemorrhage is present. Ventricles are of normal size. No significant
extraaxial fluid collection is present.

Postcontrast imaging through the remainder the brain is
unremarkable.

Vascular: Flow is present in the major intracranial arteries.

Skull and upper cervical spine: The craniocervical junction is
normal. Upper cervical spine is within normal limits. Marrow signal
is unremarkable.

Sinuses/Orbits: The paranasal sinuses and mastoid air cells are
clear. The globes and orbits are within normal limits.
IMPRESSION: 1. Stable to slight increase in size of left CP angle and IAC mass
lesion compatible with is stable a schwannoma.
2. Focal mass effect on the left cerebral peduncle and brainstem.
3. Severe vasovagal reaction following the injection MultiHance.
This is not an allergic reaction and pre treatment with steroids is
not recommended. It may be beneficial to treat this patient with
Gadavist rather than MultiHance in the future.

## 2023-12-06 ENCOUNTER — Other Ambulatory Visit: Payer: Self-pay

## 2023-12-06 DIAGNOSIS — N6324 Unspecified lump in the left breast, lower inner quadrant: Secondary | ICD-10-CM

## 2023-12-15 ENCOUNTER — Ambulatory Visit
Admission: RE | Admit: 2023-12-15 | Discharge: 2023-12-15 | Disposition: A | Discharge status: Home / Self Care | Payer: Self-pay | Source: Ambulatory Visit

## 2023-12-15 DIAGNOSIS — N6324 Unspecified lump in the left breast, lower inner quadrant: Secondary | ICD-10-CM
